# Patient Record
Sex: Male | Born: 1965 | Race: White | Hispanic: No | Marital: Married | Smoking: Never smoker
Health system: Southern US, Community
[De-identification: ages and names within clinical notes are randomized; demographics above are authoritative.]

## PROBLEM LIST (undated history)

## (undated) DIAGNOSIS — J302 Other seasonal allergic rhinitis: Secondary | ICD-10-CM

## (undated) DIAGNOSIS — E039 Hypothyroidism, unspecified: Secondary | ICD-10-CM

## (undated) DIAGNOSIS — G4733 Obstructive sleep apnea (adult) (pediatric): Secondary | ICD-10-CM

## (undated) DIAGNOSIS — T8859XA Other complications of anesthesia, initial encounter: Secondary | ICD-10-CM

## (undated) DIAGNOSIS — I10 Essential (primary) hypertension: Secondary | ICD-10-CM

## (undated) DIAGNOSIS — E785 Hyperlipidemia, unspecified: Secondary | ICD-10-CM

## (undated) DIAGNOSIS — M1990 Unspecified osteoarthritis, unspecified site: Secondary | ICD-10-CM

## (undated) DIAGNOSIS — C8290 Follicular lymphoma, unspecified, unspecified site: Secondary | ICD-10-CM

## (undated) DIAGNOSIS — C8590 Non-Hodgkin lymphoma, unspecified, unspecified site: Secondary | ICD-10-CM

## (undated) HISTORY — PX: UMBILICAL HERNIA REPAIR: SHX196

## (undated) HISTORY — PX: COLONOSCOPY: SHX174

## (undated) HISTORY — PX: KNEE ARTHROSCOPY: SUR90

---

## 2011-06-25 HISTORY — PX: LYMPH NODE DISSECTION: SHX5087

## 2019-11-17 ENCOUNTER — Other Ambulatory Visit: Payer: Self-pay | Admitting: Orthopedic Surgery

## 2019-11-17 DIAGNOSIS — M19011 Primary osteoarthritis, right shoulder: Secondary | ICD-10-CM

## 2019-12-06 ENCOUNTER — Other Ambulatory Visit: Payer: Self-pay

## 2019-12-06 ENCOUNTER — Ambulatory Visit
Admission: RE | Admit: 2019-12-06 | Discharge: 2019-12-06 | Disposition: A | Discharge status: Home / Self Care | Payer: BC Managed Care – PPO | Source: Ambulatory Visit | Attending: Orthopedic Surgery | Admitting: Orthopedic Surgery

## 2019-12-06 DIAGNOSIS — M19011 Primary osteoarthritis, right shoulder: Secondary | ICD-10-CM

## 2019-12-31 ENCOUNTER — Encounter (HOSPITAL_COMMUNITY): Payer: Self-pay

## 2019-12-31 NOTE — Progress Notes (Addendum)
COVID Vaccine Completed: Yes Date COVID Vaccine completed: 08/13/2019 COVID vaccine manufacturer:    Moderna     PCP - Dr Raliegh Ip. [facility] internal Medicine surgical clearance 12/14/19 in chart Cardiologist - N/A  Chest x-ray - request from Dr. Susie Cassette office EKG - request from Dr. Susie Cassette office Stress Test - N/A ECHO - N/A Cardiac Cath - N/A  Sleep Study - Yes CPAP - Yes  Fasting Blood Sugar - N/A Checks Blood Sugar _N/A____ times a day  Blood Thinner Instructions: N/A Aspirin Instructions: N/A Last Dose: N/A  Anesthesia review: N/A  Patient denies shortness of breath, fever, cough and chest pain at PAT appointment   Patient verbalized understanding of instructions that were given to them at the PAT appointment. Patient was also instructed that they will need to review over the PAT instructions again at home before surgery.

## 2019-12-31 NOTE — Patient Instructions (Addendum)
DUE TO COVID-19 ONLY ONE VISITOR ARE ALLOWED TO COME WITH YOU AND STAY IN THE WAITING ROOM ONLY DURING PRE OP AND PROCEDURE. THEN TWO VISITORS MAY VISIT WITH YOU IN YOUR PRIVATE ROOM DURING VISITING HOURS ONLY!!   COVID SWAB TESTING MUST BE COMPLETED ON: Monday, July 12, 1:35PM    [address], [place] [facility] pre surgical testing line (Must self quarantine after testing. Follow instructions on handout.)         Your procedure is scheduled on: Thursday, January 06, 2020   Report to [facility] Main  Entrance   Report to Short Stay at 5:30 AM   [facility])   Call this number if you have problems the morning of surgery [phone]   Do not eat food :After Midnight.   May have liquids until 4:30 AM   day of surgery   CLEAR LIQUID DIET  Foods Allowed                                                                     Foods Excluded  Water, Black Coffee and tea, regular and decaf                             liquids that you cannot  Plain Jell-O in any flavor  (No red)                                           see through such as: Fruit ices (not with fruit pulp)                                     milk, soups, orange juice  Iced Popsicles (No red)                                    All solid food                                   Apple juices Sports drinks like Gatorade (No red) Lightly seasoned clear broth or consume(fat free) Sugar, honey syrup  Sample Menu Breakfast                                Lunch                                     Supper Cranberry juice                    Beef broth                            Chicken broth Jell-O  Grape juice                           Apple juice Coffee or tea                        Jell-O                                      Popsicle                                                Coffee or tea                        Coffee or tea   Complete one Ensure drink  the morning of surgery at 4:30 AM the day of surgery.   Oral Hygiene is also important to reduce your risk of infection.                                    Remember - BRUSH YOUR TEETH THE MORNING OF SURGERY WITH YOUR REGULAR TOOTHPASTE   Do NOT smoke after Midnight   Take these medicines the morning of surgery with A SIP OF WATER: Amlodipine, Cetirizine, Levothyroxine   Bring CPAP mask and tubing day of surgery                               You may not have any metal on your body including jewelry, and body piercings             Do not wear lotions, powders, perfumes/cologne, or deodorant                          Men may shave face and neck.   Do not bring valuables to the hospital. [place].   Contacts, dentures or bridgework may not be worn into surgery.   Bring small overnight bag day of surgery.    Special Instructions: Bring a copy of your healthcare power of attorney and living will documents         the day of surgery if you haven't scanned them in before.              Please read over the following fact sheets you were given: IF YOU HAVE QUESTIONS ABOUT YOUR PRE OP INSTRUCTIONS PLEASE CALL [place]- Preparing for Total Shoulder Arthroplasty    Before surgery, you can play an important role. Because skin is not sterile, your skin needs to be as free of germs as possible. You can reduce the number of germs on your skin by using the following products. . Benzoyl Peroxide Gel o Reduces the number of germs present on the skin o Applied twice a day to shoulder area starting two days before surgery    ==================================================================  Please follow these instructions carefully:  BENZOYL PEROXIDE 5% GEL  Please do not use if  you have an allergy to benzoyl peroxide.   If your skin becomes reddened/irritated stop using the benzoyl peroxide.  Starting two days before surgery,  apply as follows: (Tuesday and Wednesday) 1. Apply benzoyl peroxide in the morning and at night. Apply after taking a shower. If you are not taking a shower clean entire shoulder front, back, and side along with the armpit with a clean wet washcloth.  2. Place a quarter-sized dollop on your shoulder and rub in thoroughly, making sure to cover the front, back, and side of your shoulder, along with the armpit.   2 days before ____ AM   ____ PM              1 day before ____ AM   ____ PM                         3. Do this twice a day for two days.  (Last application is the night before surgery, AFTER using the CHG soap as described below).  4. Do NOT apply benzoyl peroxide gel on the day of surgery.  [place] - Preparing for Surgery Before surgery, you can play an important role.  Because skin is not sterile, your skin needs to be as free of germs as possible.  You can reduce the number of germs on your skin by washing with CHG (chlorahexidine gluconate) soap before surgery.  CHG is an antiseptic cleaner which kills germs and bonds with the skin to continue killing germs even after washing. Please DO NOT use if you have an allergy to CHG or antibacterial soaps.  If your skin becomes reddened/irritated stop using the CHG and inform your nurse when you arrive at Short Stay. Do not shave (including legs and underarms) for at least 48 hours prior to the first CHG shower.  You may shave your face/neck.  Please follow these instructions carefully:  1.  Shower with CHG Soap the night before surgery and the  morning of surgery.  2.  If you choose to wash your hair, wash your hair first as usual with your normal  shampoo.  3.  After you shampoo, rinse your hair and body thoroughly to remove the shampoo.                             4.  Use CHG as you would any other liquid soap.  You can apply chg directly to the skin and wash.  Gently with a scrungie or clean washcloth.  5.  Apply the CHG Soap to your  body ONLY FROM THE NECK DOWN.   Do   not use on face/ open                           Wound or open sores. Avoid contact with eyes, ears mouth and   genitals (private parts).                       Wash face,  Genitals (private parts) with your normal soap.             6.  Wash thoroughly, paying special attention to the area where your    surgery  will be performed.  7.  Thoroughly rinse your body with warm water from the neck down.  8.  DO NOT shower/wash with your normal soap after  using and rinsing off the CHG Soap.                9.  Pat yourself dry with a clean towel.            10.  Wear clean pajamas.            11.  Place clean sheets on your bed the night of your first shower and do not  sleep with pets. Day of Surgery : Do not apply any lotions/deodorants the morning of surgery.  Please wear clean clothes to the hospital/surgery center.  FAILURE TO FOLLOW THESE INSTRUCTIONS MAY RESULT IN THE CANCELLATION OF YOUR SURGERY  PATIENT SIGNATURE_________________________________  NURSE SIGNATURE__________________________________  ________________________________________________________________________   Adam Phenix  An incentive spirometer is a tool that can help keep your lungs clear and active. This tool measures how well you are filling your lungs with each breath. Taking long deep breaths may help reverse or decrease the chance of developing breathing (pulmonary) problems (especially infection) following:  A long period of time when you are unable to move or be active. BEFORE THE PROCEDURE   If the spirometer includes an indicator to show your best effort, your nurse or respiratory therapist will set it to a desired goal.  If possible, sit up straight or lean slightly forward. Try not to slouch.  Hold the incentive spirometer in an upright position. INSTRUCTIONS FOR USE  1. Sit on the edge of your bed if possible, or sit up as far as you can in bed or on a  chair. 2. Hold the incentive spirometer in an upright position. 3. Breathe out normally. 4. Place the mouthpiece in your mouth and seal your lips tightly around it. 5. Breathe in slowly and as deeply as possible, raising the piston or the ball toward the top of the column. 6. Hold your breath for 3-5 seconds or for as long as possible. Allow the piston or ball to fall to the bottom of the column. 7. Remove the mouthpiece from your mouth and breathe out normally. 8. Rest for a few seconds and repeat Steps 1 through 7 at least 10 times every 1-2 hours when you are awake. Take your time and take a few normal breaths between deep breaths. 9. The spirometer may include an indicator to show your best effort. Use the indicator as a goal to work toward during each repetition. 10. After each set of 10 deep breaths, practice coughing to be sure your lungs are clear. If you have an incision (the cut made at the time of surgery), support your incision when coughing by placing a pillow or rolled up towels firmly against it. Once you are able to get out of bed, walk around indoors and cough well. You may stop using the incentive spirometer when instructed by your caregiver.  RISKS AND COMPLICATIONS  Take your time so you do not get dizzy or light-headed.  If you are in pain, you may need to take or ask for pain medication before doing incentive spirometry. It is harder to take a deep breath if you are having pain. AFTER USE  Rest and breathe slowly and easily.  It can be helpful to keep track of a log of your progress. Your caregiver can provide you with a simple table to help with this. If you are using the spirometer at home, follow these instructions: [place] IF:   You are having difficultly using the spirometer.  You have trouble  using the spirometer as often as instructed.  Your pain medication is not giving enough relief while using the spirometer.  You develop fever of 100.5 F  (38.1 C) or higher. SEEK IMMEDIATE MEDICAL CARE IF:   You cough up bloody sputum that had not been present before.  You develop fever of 102 F (38.9 C) or greater.  You develop worsening pain at or near the incision site. MAKE SURE YOU:   Understand these instructions.  Will watch your condition.  Will get help right away if you are not doing well or get worse. Document Released: 10/21/2006 Document Revised: 09/02/2011 Document Reviewed: 12/22/2006 [facility] Patient Information 2014 [place], [place].   ________________________________________________________________________

## 2020-01-03 ENCOUNTER — Other Ambulatory Visit: Payer: Self-pay

## 2020-01-03 ENCOUNTER — Other Ambulatory Visit (HOSPITAL_COMMUNITY)
Admission: RE | Admit: 2020-01-03 | Discharge: 2020-01-03 | Disposition: A | Discharge status: Home / Self Care | Payer: BC Managed Care – PPO | Source: Ambulatory Visit | Attending: Orthopedic Surgery | Admitting: Orthopedic Surgery

## 2020-01-03 ENCOUNTER — Encounter (HOSPITAL_COMMUNITY): Payer: Self-pay

## 2020-01-03 ENCOUNTER — Encounter (HOSPITAL_COMMUNITY)
Admission: RE | Admit: 2020-01-03 | Discharge: 2020-01-03 | Disposition: A | Discharge status: Home / Self Care | Payer: BC Managed Care – PPO | Source: Ambulatory Visit | Attending: Orthopedic Surgery | Admitting: Orthopedic Surgery

## 2020-01-03 DIAGNOSIS — Z01812 Encounter for preprocedural laboratory examination: Secondary | ICD-10-CM | POA: Diagnosis not present

## 2020-01-03 DIAGNOSIS — Z20822 Contact with and (suspected) exposure to covid-19: Secondary | ICD-10-CM | POA: Insufficient documentation

## 2020-01-03 HISTORY — DX: Other seasonal allergic rhinitis: J30.2

## 2020-01-03 HISTORY — DX: Non-Hodgkin lymphoma, unspecified, unspecified site: C85.90

## 2020-01-03 HISTORY — DX: Hyperlipidemia, unspecified: E78.5

## 2020-01-03 HISTORY — DX: Other complications of anesthesia, initial encounter: T88.59XA

## 2020-01-03 HISTORY — DX: Follicular lymphoma, unspecified, unspecified site: C82.90

## 2020-01-03 HISTORY — DX: Obstructive sleep apnea (adult) (pediatric): G47.33

## 2020-01-03 HISTORY — DX: Unspecified osteoarthritis, unspecified site: M19.90

## 2020-01-03 HISTORY — DX: Essential (primary) hypertension: I10

## 2020-01-03 HISTORY — DX: Hypothyroidism, unspecified: E03.9

## 2020-01-03 LAB — BASIC METABOLIC PANEL
Anion gap: 9 (ref 5–15)
BUN: 18 mg/dL (ref 6–20)
CO2: 28 mmol/L (ref 22–32)
Calcium: 9.2 mg/dL (ref 8.9–10.3)
Chloride: 102 mmol/L (ref 98–111)
Creatinine, Ser: 0.95 mg/dL (ref 0.61–1.24)
GFR calc Af Amer: >60 mL/min (ref >60)
GFR calc non Af Amer: >60 mL/min (ref >60)
Glucose, Bld: 93 mg/dL (ref 70–99)
Potassium: 3.9 mmol/L (ref 3.5–5.1)
Sodium: 139 mmol/L (ref 135–145)

## 2020-01-03 LAB — CBC
HCT: 44.4 % (ref 39.0–52.0)
Hemoglobin: 14.6 g/dL (ref 13.0–17.0)
MCH: 30.4 pg (ref 26.0–34.0)
MCHC: 32.9 g/dL (ref 30.0–36.0)
MCV: 92.5 fL (ref 80.0–100.0)
Platelets: 151 10*3/uL (ref 150–400)
RBC: 4.8 MIL/uL (ref 4.22–5.81)
RDW: 13.7 % (ref 11.5–15.5)
WBC: 7.3 10*3/uL (ref 4.0–10.5)
nRBC: 0 % (ref 0.0–0.2)

## 2020-01-03 LAB — SURGICAL PCR SCREEN
MRSA, PCR: NEGATIVE
Staphylococcus aureus: POSITIVE — AB

## 2020-01-03 LAB — SARS CORONAVIRUS 2 (TAT 6-24 HRS): SARS Coronavirus 2: NEGATIVE

## 2020-01-05 MED ORDER — DEXTROSE 5 % IV SOLN
3.0000 g | INTRAVENOUS | Status: AC
  Administered 2020-01-06: 2 g via INTRAVENOUS
  Filled 2020-01-05: qty 3

## 2020-01-05 NOTE — Anesthesia Preprocedure Evaluation (Addendum)
Anesthesia Evaluation   Patient awake    Reviewed: Allergy & Precautions, NPO status , Patient's Chart, lab work & pertinent test results  History of Anesthesia Complications (+) DIFFICULT AIRWAY and history of anesthetic complications  Airway Mallampati: III       Dental no notable dental hx.    Pulmonary sleep apnea and Continuous Positive Airway Pressure Ventilation ,    Pulmonary exam normal        Cardiovascular hypertension, Pt. on medications Normal cardiovascular exam     Neuro/Psych negative neurological ROS  negative psych ROS   GI/Hepatic negative GI ROS, Neg liver ROS,   Endo/Other  Hypothyroidism Morbid obesity  Renal/GU negative Renal ROS  negative genitourinary   Musculoskeletal  (+) Arthritis , Osteoarthritis,    Abdominal (+) + obese,   Peds  Hematology   Anesthesia Other Findings   Reproductive/Obstetrics                            Anesthesia Physical Anesthesia Plan  ASA: III  Anesthesia Plan: General   Post-op Pain Management:  Regional for Post-op pain   Induction: Intravenous  PONV Risk Score and Plan: 2 and Midazolam, Ondansetron and Dexamethasone  Airway Management Planned: Oral ETT and Video Laryngoscope Planned  Additional Equipment: None  Intra-op Plan:   Post-operative Plan: Extubation in OR  Informed Consent: I have reviewed the patients History and Physical, chart, labs and discussed the procedure including the risks, benefits and alternatives for the proposed anesthesia with the patient or authorized representative who has indicated his/her understanding and acceptance.       Plan Discussed with: CRNA  Anesthesia Plan Comments:        Anesthesia Quick Evaluation

## 2020-01-05 NOTE — Progress Notes (Addendum)
PCR results 01/04/2020 faxed via epic to Dr. Susie Cassette office.  Second request: Called to check with Claiborne Billings to see if Dr. Doran Clay office had sent over the EKG tracing.  She will follow up and fax to [place].

## 2020-01-05 NOTE — Progress Notes (Signed)
EKG 11/2019 from Dr. Doran Clay office received and placed in chart.

## 2020-01-06 ENCOUNTER — Ambulatory Visit (HOSPITAL_COMMUNITY)
Admission: RE | Admit: 2020-01-06 | Discharge: 2020-01-06 | Disposition: A | Discharge status: Home / Self Care | Payer: BC Managed Care – PPO | Attending: Orthopedic Surgery | Admitting: Orthopedic Surgery

## 2020-01-06 ENCOUNTER — Encounter (HOSPITAL_COMMUNITY): Payer: Self-pay | Admitting: Orthopedic Surgery

## 2020-01-06 ENCOUNTER — Ambulatory Visit (HOSPITAL_COMMUNITY): Payer: BC Managed Care – PPO | Admitting: Anesthesiology

## 2020-01-06 ENCOUNTER — Encounter (HOSPITAL_COMMUNITY)
Admission: RE | Disposition: A | Discharge status: Home / Self Care | Payer: Self-pay | Source: Home / Self Care | Attending: Orthopedic Surgery

## 2020-01-06 DIAGNOSIS — Z79899 Other long term (current) drug therapy: Secondary | ICD-10-CM | POA: Insufficient documentation

## 2020-01-06 DIAGNOSIS — Z8572 Personal history of non-Hodgkin lymphomas: Secondary | ICD-10-CM | POA: Diagnosis not present

## 2020-01-06 DIAGNOSIS — E785 Hyperlipidemia, unspecified: Secondary | ICD-10-CM | POA: Insufficient documentation

## 2020-01-06 DIAGNOSIS — E039 Hypothyroidism, unspecified: Secondary | ICD-10-CM | POA: Insufficient documentation

## 2020-01-06 DIAGNOSIS — M19011 Primary osteoarthritis, right shoulder: Secondary | ICD-10-CM | POA: Diagnosis not present

## 2020-01-06 DIAGNOSIS — Z6841 Body Mass Index (BMI) 40.0 and over, adult: Secondary | ICD-10-CM | POA: Diagnosis not present

## 2020-01-06 DIAGNOSIS — Z79890 Hormone replacement therapy (postmenopausal): Secondary | ICD-10-CM | POA: Insufficient documentation

## 2020-01-06 DIAGNOSIS — Z791 Long term (current) use of non-steroidal anti-inflammatories (NSAID): Secondary | ICD-10-CM | POA: Insufficient documentation

## 2020-01-06 DIAGNOSIS — I10 Essential (primary) hypertension: Secondary | ICD-10-CM | POA: Insufficient documentation

## 2020-01-06 DIAGNOSIS — G4733 Obstructive sleep apnea (adult) (pediatric): Secondary | ICD-10-CM | POA: Diagnosis not present

## 2020-01-06 HISTORY — PX: TOTAL SHOULDER ARTHROPLASTY: SHX126

## 2020-01-06 SURGERY — ARTHROPLASTY, SHOULDER, TOTAL
Anesthesia: General | Site: Shoulder | Laterality: Right

## 2020-01-06 MED ORDER — DEXAMETHASONE SODIUM PHOSPHATE 10 MG/ML IJ SOLN
INTRAMUSCULAR | Status: DC | PRN
  Administered 2020-01-06: 10 mg via INTRAVENOUS

## 2020-01-06 MED ORDER — HYDROMORPHONE HCL 1 MG/ML IJ SOLN: 0.2500 mg | INTRAMUSCULAR | Status: DC | PRN

## 2020-01-06 MED ORDER — LACTATED RINGERS IV BOLUS
500.0000 mL | Freq: Once | INTRAVENOUS | Status: AC
  Administered 2020-01-06: 500 mL via INTRAVENOUS

## 2020-01-06 MED ORDER — VANCOMYCIN HCL 1000 MG IV SOLR
INTRAVENOUS | Status: AC
  Filled 2020-01-06: qty 1000

## 2020-01-06 MED ORDER — VANCOMYCIN HCL 1000 MG IV SOLR
INTRAVENOUS | Status: DC | PRN
  Administered 2020-01-06: 1000 mg via TOPICAL

## 2020-01-06 MED ORDER — MIDAZOLAM HCL 2 MG/2ML IJ SOLN
INTRAMUSCULAR | Status: DC | PRN
  Administered 2020-01-06: 2 mg via INTRAVENOUS

## 2020-01-06 MED ORDER — CYCLOBENZAPRINE HCL 10 MG PO TABS
10.0000 mg | ORAL_TABLET | Freq: Three times a day (TID) | ORAL | 1 refills | Status: AC | PRN
Start: 2020-01-06 — End: ?

## 2020-01-06 MED ORDER — PROPOFOL 10 MG/ML IV BOLUS
INTRAVENOUS | Status: AC
  Filled 2020-01-06: qty 20

## 2020-01-06 MED ORDER — PHENYLEPHRINE HCL-NACL 10-0.9 MG/250ML-% IV SOLN
INTRAVENOUS | Status: DC | PRN
Start: 2020-01-06 — End: 2020-01-06
  Administered 2020-01-06 (×2): 25 ug/min via INTRAVENOUS

## 2020-01-06 MED ORDER — PROPOFOL 10 MG/ML IV BOLUS
INTRAVENOUS | Status: DC | PRN
  Administered 2020-01-06: 200 mg via INTRAVENOUS

## 2020-01-06 MED ORDER — MELOXICAM 15 MG PO TABS: 15.0000 mg | ORAL_TABLET | Freq: Every day | ORAL | 1 refills | Status: AC

## 2020-01-06 MED ORDER — KETOROLAC TROMETHAMINE 30 MG/ML IJ SOLN: 30.0000 mg | Freq: Once | INTRAMUSCULAR | Status: DC | PRN

## 2020-01-06 MED ORDER — LACTATED RINGERS IV BOLUS: 250.0000 mL | Freq: Once | INTRAVENOUS | Status: DC

## 2020-01-06 MED ORDER — 0.9 % SODIUM CHLORIDE (POUR BTL) OPTIME
TOPICAL | Status: DC | PRN
  Administered 2020-01-06: 1000 mL

## 2020-01-06 MED ORDER — BUPIVACAINE-EPINEPHRINE (PF) 0.5% -1:200000 IJ SOLN
INTRAMUSCULAR | Status: DC | PRN
  Administered 2020-01-06 (×5): 3 mL via PERINEURAL

## 2020-01-06 MED ORDER — ONDANSETRON HCL 4 MG/2ML IJ SOLN
INTRAMUSCULAR | Status: DC | PRN
  Administered 2020-01-06: 4 mg via INTRAVENOUS

## 2020-01-06 MED ORDER — LACTATED RINGERS IV SOLN: INTRAVENOUS | Status: DC

## 2020-01-06 MED ORDER — OXYCODONE-ACETAMINOPHEN 5-325 MG PO TABS: 1.0000 | ORAL_TABLET | ORAL | 0 refills | Status: AC | PRN

## 2020-01-06 MED ORDER — TRANEXAMIC ACID-NACL 1000-0.7 MG/100ML-% IV SOLN
1000.0000 mg | INTRAVENOUS | Status: AC
  Administered 2020-01-06: 1000 mg via INTRAVENOUS
  Filled 2020-01-06 (×2): qty 100

## 2020-01-06 MED ORDER — FENTANYL CITRATE (PF) 250 MCG/5ML IJ SOLN
INTRAMUSCULAR | Status: DC | PRN
  Administered 2020-01-06 (×5): 50 ug via INTRAVENOUS

## 2020-01-06 MED ORDER — MIDAZOLAM HCL 2 MG/2ML IJ SOLN
INTRAMUSCULAR | Status: AC
  Filled 2020-01-06: qty 2

## 2020-01-06 MED ORDER — MEPERIDINE HCL 50 MG/ML IJ SOLN: 6.2500 mg | INTRAMUSCULAR | Status: DC | PRN

## 2020-01-06 MED ORDER — SUGAMMADEX SODIUM 500 MG/5ML IV SOLN
INTRAVENOUS | Status: DC | PRN
  Administered 2020-01-06: 400 mg via INTRAVENOUS

## 2020-01-06 MED ORDER — STERILE WATER FOR IRRIGATION IR SOLN
Status: DC | PRN
  Administered 2020-01-06: 2000 mL

## 2020-01-06 MED ORDER — CHLORHEXIDINE GLUCONATE 0.12 % MT SOLN
15.0000 mL | Freq: Once | OROMUCOSAL | Status: AC
  Administered 2020-01-06: 15 mL via OROMUCOSAL

## 2020-01-06 MED ORDER — BUPIVACAINE LIPOSOME 1.3 % IJ SUSP
INTRAMUSCULAR | Status: DC | PRN
  Administered 2020-01-06 (×5): 2 mL via PERINEURAL

## 2020-01-06 MED ORDER — FENTANYL CITRATE (PF) 250 MCG/5ML IJ SOLN
INTRAMUSCULAR | Status: AC
  Filled 2020-01-06: qty 5

## 2020-01-06 MED ORDER — PROMETHAZINE HCL 25 MG/ML IJ SOLN: 6.2500 mg | INTRAMUSCULAR | Status: DC | PRN

## 2020-01-06 MED ORDER — ROCURONIUM BROMIDE 10 MG/ML (PF) SYRINGE
PREFILLED_SYRINGE | INTRAVENOUS | Status: DC | PRN
  Administered 2020-01-06: 20 mg via INTRAVENOUS
  Administered 2020-01-06: 40 mg via INTRAVENOUS

## 2020-01-06 MED ORDER — LIDOCAINE 2% (20 MG/ML) 5 ML SYRINGE
INTRAMUSCULAR | Status: DC | PRN
  Administered 2020-01-06: 100 mg via INTRAVENOUS

## 2020-01-06 MED ORDER — ONDANSETRON HCL 4 MG PO TABS: 4.0000 mg | ORAL_TABLET | Freq: Three times a day (TID) | ORAL | 0 refills | Status: AC | PRN

## 2020-01-06 MED ORDER — SUCCINYLCHOLINE CHLORIDE 200 MG/10ML IV SOSY
PREFILLED_SYRINGE | INTRAVENOUS | Status: DC | PRN
  Administered 2020-01-06: 200 mg via INTRAVENOUS

## 2020-01-06 MED ORDER — ORAL CARE MOUTH RINSE: 15.0000 mL | Freq: Once | OROMUCOSAL | Status: AC

## 2020-01-06 SURGICAL SUPPLY — 64 items
BAG ZIPLOCK 12X15 (MISCELLANEOUS) ×3 IMPLANT
BIT DRILL 2.0X128 (BIT) ×2 IMPLANT
BIT DRILL 2.0X128MM (BIT) ×1
BLADE SAW SGTL 83.5X18.5 (BLADE) ×3 IMPLANT
BODY HUM ECLIPSE TRUNION OD 51 (Shoulder) ×3 IMPLANT
CEMENT BONE DEPUY (Cement) ×3 IMPLANT
COOLER ICEMAN CLASSIC (MISCELLANEOUS) IMPLANT
COVER BACK TABLE 60X90IN (DRAPES) ×3 IMPLANT
COVER SURGICAL LIGHT HANDLE (MISCELLANEOUS) ×3 IMPLANT
COVER WAND RF STERILE (DRAPES) IMPLANT
DERMABOND ADVANCED (GAUZE/BANDAGES/DRESSINGS) ×2
DERMABOND ADVANCED .7 DNX12 (GAUZE/BANDAGES/DRESSINGS) ×1 IMPLANT
DRAPE ORTHO SPLIT 77X108 STRL (DRAPES) ×4
DRAPE SHEET LG 3/4 BI-LAMINATE (DRAPES) ×3 IMPLANT
DRAPE SURG 17X11 SM STRL (DRAPES) ×3 IMPLANT
DRAPE SURG ORHT 6 SPLT 77X108 (DRAPES) ×2 IMPLANT
DRAPE U-SHAPE 47X51 STRL (DRAPES) ×3 IMPLANT
DRSG AQUACEL AG ADV 3.5X10 (GAUZE/BANDAGES/DRESSINGS) ×3 IMPLANT
DURAPREP 26ML APPLICATOR (WOUND CARE) ×6 IMPLANT
ELECT BLADE TIP CTD 4 INCH (ELECTRODE) ×3 IMPLANT
ELECT REM PT RETURN 15FT ADLT (MISCELLANEOUS) ×3 IMPLANT
FACESHIELD WRAPAROUND (MASK) ×15 IMPLANT
GLENDOID UNI VAULTLOCK EXLG (Shoulder) ×3 IMPLANT
GLENOID UNI VAULTLOCK EXLG (Shoulder) ×1 IMPLANT
GLOVE BIO SURGEON STRL SZ7.5 (GLOVE) ×3 IMPLANT
GLOVE BIO SURGEON STRL SZ8 (GLOVE) ×3 IMPLANT
GLOVE SS BIOGEL STRL SZ 7 (GLOVE) ×1 IMPLANT
GLOVE SS BIOGEL STRL SZ 7.5 (GLOVE) ×1 IMPLANT
GLOVE SUPERSENSE BIOGEL SZ 7 (GLOVE) ×2
GLOVE SUPERSENSE BIOGEL SZ 7.5 (GLOVE) ×2
GOWN STRL REUS W/TWL LRG LVL3 (GOWN DISPOSABLE) ×6 IMPLANT
HEAD HUMERAL ECLIPSE 49/20 (Shoulder) ×3 IMPLANT
IMPL ECLIPSE SPEEDCAP (Shoulder) ×1 IMPLANT
IMPLANT ECLIPSE SPEEDCAP (Shoulder) ×3 IMPLANT
KIT BASIN OR (CUSTOM PROCEDURE TRAY) ×3 IMPLANT
KIT TURNOVER KIT A (KITS) IMPLANT
MANIFOLD NEPTUNE II (INSTRUMENTS) ×3 IMPLANT
MARKER SKIN DUAL TIP RULER LAB (MISCELLANEOUS) ×3 IMPLANT
NEEDLE TAPERED W/ NITINOL LOOP (MISCELLANEOUS) IMPLANT
NS IRRIG 1000ML POUR BTL (IV SOLUTION) ×3 IMPLANT
PACK SHOULDER (CUSTOM PROCEDURE TRAY) ×3 IMPLANT
PAD COLD SHLDR WRAP-ON (PAD) IMPLANT
PROTECTOR NERVE ULNAR (MISCELLANEOUS) ×3 IMPLANT
RESTRAINT HEAD UNIVERSAL NS (MISCELLANEOUS) ×3 IMPLANT
SCREW SPINAL 40 F/CAGE LRG (Screw) ×3 IMPLANT
SLING ARM FOAM STRAP LRG (SOFTGOODS) IMPLANT
SLING ARM FOAM STRAP MED (SOFTGOODS) IMPLANT
SLING ARM FOAM STRAP XLG (SOFTGOODS) ×3 IMPLANT
SMARTMIX MINI TOWER (MISCELLANEOUS)
SPONGE LAP 18X18 RF (DISPOSABLE) IMPLANT
SUCTION FRAZIER HANDLE 12FR (TUBING) ×2
SUCTION TUBE FRAZIER 12FR DISP (TUBING) ×1 IMPLANT
SUT FIBERWIRE #2 38 T-5 BLUE (SUTURE)
SUT MNCRL AB 3-0 PS2 18 (SUTURE) ×3 IMPLANT
SUT MON AB 2-0 CT1 36 (SUTURE) ×3 IMPLANT
SUT VIC AB 1 CT1 36 (SUTURE) ×9 IMPLANT
SUTURE FIBERWR #2 38 T-5 BLUE (SUTURE) IMPLANT
SUTURE TAPE 1.3 40 TPR END (SUTURE) IMPLANT
SUTURETAPE 1.3 40 TPR END (SUTURE)
TOWEL OR 17X26 10 PK STRL BLUE (TOWEL DISPOSABLE) ×3 IMPLANT
TOWEL OR NON WOVEN STRL DISP B (DISPOSABLE) ×3 IMPLANT
TOWER SMARTMIX MINI (MISCELLANEOUS) IMPLANT
WATER STERILE IRR 1000ML POUR (IV SOLUTION) ×6 IMPLANT
YANKAUER SUCT BULB TIP 10FT TU (MISCELLANEOUS) ×3 IMPLANT

## 2020-01-06 NOTE — Anesthesia Procedure Notes (Signed)
Procedure Name: Intubation Date/Time: 01/06/2020 7:38 AM Performed by: Cynda Familia, CRNA Pre-anesthesia Checklist: Patient identified, Emergency Drugs available, Suction available and Patient being monitored Patient Re-evaluated:Patient Re-evaluated prior to induction Oxygen Delivery Method: Circle System Utilized Preoxygenation: Pre-oxygenation with 100% oxygen Induction Type: IV induction, Rapid sequence and Cricoid Pressure applied Ventilation: Mask ventilation without difficulty Laryngoscope Size: Miller and 2 Grade View: Grade I Tube type: Oral Tube size: 7.5 mm Number of attempts: 1 Airway Equipment and Method: Stylet Placement Confirmation: ETT inserted through vocal cords under direct vision,  positive ETCO2 and breath sounds checked- equal and bilateral Secured at: 22 cm Tube secured with: Tape Dental Injury: Teeth and Oropharynx as per pre-operative assessment  Comments: Smooth IV induction Hatchette-- intubation AM CRNA atraumatic-- teeth and mouth as preop- bilat BS

## 2020-01-06 NOTE — Anesthesia Procedure Notes (Signed)
Performed by: Con Arganbright M, CRNA Oxygen Delivery Method: Simple face mask Placement Confirmation: positive ETCO2 and breath sounds checked- equal and bilateral Dental Injury: Teeth and Oropharynx as per pre-operative assessment        

## 2020-01-06 NOTE — Op Note (Signed)
01/06/2020  10:31 AM  PATIENT:   Jason Gill  54 y.o. male  PRE-OPERATIVE DIAGNOSIS:  Right shoulder osteoarthritis  POST-OPERATIVE DIAGNOSIS: Same  PROCEDURE: Anatomic right total shoulder arthroplasty utilizing a stemless Arthrex Eclipse implant size 49 x 20 humeral head and an extra large polyethylene glenoid.  SURGEON:  Marin Shutter M.D.  ASSISTANTS: Jenetta Loges, PA-C  ANESTHESIA:   General endotracheal and interscalene block with Exparel  EBL: 300 cc  SPECIMEN: None  Drains: None   PATIENT DISPOSITION:  PACU - hemodynamically stable.    PLAN OF CARE: Discharge to home after PACU  Brief history:  Jason Gill is a 54 year old gentleman who has had a long history of right shoulder pain and restricted mobility with increasing functional rotations related to advanced osteoarthritis.  His recent plain film x-rays confirm complete loss of joint space with significant bony deformity and large peripheral osteophytes as well as loose bodies anterior and posterior to the joint.  Preoperative CT scan was obtained to help with preoperative planning.  Due to his increasing pain and functional rotations he is brought to the operating this time for planned right shoulder anatomic plasty.  Preoperatively, I counseled the patient regarding treatment options and risks versus benefits thereof.  Possible surgical complications were all reviewed including potential for bleeding, infection, neurovascular injury, persistent pain, loss of motion, anesthetic complication, failure of the implant, and possible need for additional surgery. They understand and accept and agrees with our planned procedure.   Procedure detail:  After undergoing routine preop evaluation patient received prophylactic antibiotics and interscalene block with Exparel established in the holding area by the anesthesia department.  Patient was subsequently placed supine in the operating table and underwent the smooth  induction of a general endotracheal anesthesia.  Then placed into the beachchair position and appropriately padded and protected.  The right shoulder girdle region was then sterilely prepped and draped in standard fashion.  Timeout was called.  An anterior deltopectoral approach to the right shoulder is made through a 12 cm incision.  Skin flaps were elevated dissection carried deeply and the deltopectoral interval was developed from proximal to distal with the vein taken laterally.  The upper centimeter half the pectoralis major was tenotomized for exposure and the long head biceps tendon was then tenodesed at the upper border the pectoralis major with proximal segment unroofed and excised.  Conjoined tendon mobilized retracted medially the rotator cuff was then split along the rotator interval to the base of the coracoid and then we identified the superior and inferior margins of the subscapularis insertion onto the lesser tuberosity and with this outlined we used an oscillating saw to create a lesser tuberosity osteotomy removing a thin wafer of bone and subsequently mobilized the subscapularis which was then tagged and reflected medially.  Capsular attachments from the anterior and inferior margins of the humeral neck were then divided in a subperiosteal fashion and there was a very large osteophyte on the humeral neck which we excised with combination of osteotomes and the rondure allowing complete visualization of the margin of the humeral neck.  Once the humeral head was then delivered through the wound we outlined our proposed humeral head resection using our extra medullary guide and an oscillating saw was then used to perform our humeral head cut carefully protecting the rotator cuff superiorly and posteriorly.  We then removed the balance of the osteophytes from the margins of the humeral neck.  We then sized the proximal humerus at a  49.  The proximal humerus was then prepared localizing the position for  the ultimate use of our central screw on the stemless implant.  A size 50 metal cap was then placed over the cut proximal humeral surface and at this point we then gained exposure of the glenoid using appropriate retractors.  Soft tissue releases were completed such that appropriate closure could be obtained.  We then performed a circumferential labral resection gaining complete visualization of the periphery of the glenoid.  We then utilized our VIP guide for positioning of our guidepin into the glenoid which was based off of the preoperative CT scan.  Once the guide was positioned a guidepin was then directed into the center of the glenoid and this indeed confirmed that the extra large implant was the appropriate size.  The glenoid was then reamed with the extra large reamer to a stable subchondral bone bed.  Glenoid preparation was then completed with our central drill and the guide was then used to place the superior and inferior peg and slot respectively and the plan was then broached and a trial showed excellent fit and fixation.  Of note he had extremely sclerotic bone.  At this point cement was mixed and introduced into the superior and inferior peg and slot respectively.  Morselized bone was then impacted around the central peg of the glenoid and the glenoid was then impacted with excellent fit and fixation.  At this point we then returned our attention back to the proximal humerus where the metal cap was removed.  We placed the central screw of our stem was implanted after the collar had been impacted and he achieved excellent fixation.  We then performed a series of trial reductions and ultimately felt that the 49 x 20 head gave [place] the appropriate soft tissue balance with approximately 50% translation across the glenoid.  At this point the trial was removed.  We placed a series of 3 medial row fiber tape sutures to assist with the repair of the subscapularis.  Once these were placed the final humeral head  was then impacted after the [facility] taper was cleaned and dried.  We then performed a final reduction again showing excellent soft tissue balancing and good motion and stability.  At this time we will mobilized the subscapularis and identified that there was a very large osteochondral body within the most medial aspect of the subscapularis which was sessile in its positioning and had apparently developed on the extra articular side of the subscapularis deep beneath the conjoined tendon.  This was a an extremely large fragment of bone and was felt to potentially compromise the functioning of the subscap and the implant and as such meticulous dissection of this large bony fragment was performed ultimately removing it as a single dominant fragment with 2 smaller pieces excised and dimensions were approximately 3 x 3 x 4cm.  I should also mention that there was a very large posterior osteochondral body in the posterior soft tissues which we also removed prior to implantation of the glenoid.  Once these large extra-articular osteochondral bodies were removed we confirmed that the subscapularis had good mobility and good elasticity.  We then utilized a double row repair technique with the 3 previously placed medial row suture anchors passing through the bone tendon junction of the lesser tuberosity osteotomy and these were then placed in a sequential fashion into 2 Lateral Row anchors which allowed excellent reapposition of the LTO against the donor site on the metaphysis and  excellent stability was achieved.  Then repaired the rotator interval with the suture tape sutures.  Upon completion the arm easily showed 45 degrees of external rotation without excessive tension on the subscap repair.  The wound was then copious irrigated.  We did apply copious amounts of powdered vancomycin into the deep and superficial soft tissue layers.  The deltopectoral interval was repaired with a series of figure-of-eight and 1 Vicryl  sutures.  2-0 Vicryl used for subcu layer and intracuticular 3 Monocryl for the skin followed by Dermabond Aquacel dressing.  Right arm was placed into a sling immobilizer and the patient was awakened, extubated, and taken to the recovery room in stable condition.  Jenetta Loges, PA-C was utilized as an Environmental consultant throughout this case, essential for help with positioning the patient, positioning extremity, tissue manipulation, implantation of the prosthesis, suture management, wound closure, and intraoperative decision-making.  Marin Shutter MD   Contact # [phone]

## 2020-01-06 NOTE — Discharge Instructions (Signed)
 Kevin M. Supple, M.D., F.A.A.O.S. Orthopaedic Surgery Specializing in Arthroscopic and Reconstructive Surgery of the Shoulder [phone] [address] - [place], [place] [address] - Fax [phone]   POST-OP TOTAL SHOULDER REPLACEMENT INSTRUCTIONS  1. Follow up in the office for your first post-op appointment 10-14 days from the date of your surgery. If you do not already have a scheduled appointment, our office will contact you to schedule.  2. The bandage over your incision is waterproof. You may begin showering with this dressing on. You may leave this dressing on until first follow up appointment within 2 weeks. We prefer you leave this dressing in place until follow up however after 5-7 days if you are having itching or skin irritation and would like to remove it you may do so. Go slow and tug at the borders gently to break the bond the dressing has with the skin. At this point if there is no drainage it is okay to go without a bandage or you may cover it with a light guaze and tape. You can also expect significant bruising around your shoulder that will drift down your arm and into your chest wall. This is very normal and should resolve over several days.   3. Wear your sling/immobilizer at all times except to perform the exercises below or to occasionally let your arm dangle by your side to stretch your elbow. You also need to sleep in your sling immobilizer until instructed otherwise. It is ok to remove your sling if you are sitting in a controlled environment and allow your arm to rest in a position of comfort by your side or on your lap with pillows to give your neck and skin a break from the sling. You may remove it to allow arm to dangle by side to shower. If you are up walking around and when you go to sleep at night you need to wear it.  4. Range of motion to your elbow, wrist, and hand are encouraged 3-5 times daily. Exercise to your hand and fingers helps to reduce  swelling you may experience.   5. Prescriptions for a pain medication and a muscle relaxant are provided for you. It is recommended that if you are experiencing pain that you pain medication alone is not controlling, add the muscle relaxant along with the pain medication which can give additional pain relief. The first 1-2 days is generally the most severe of your pain and then should gradually decrease. As your pain lessens it is recommended that you decrease your use of the pain medications to an "as needed basis'" only and to always comply with the recommended dosages of the pain medications.  6. Pain medications can produce constipation along with their use. If you experience this, the use of an over the counter stool softener or laxative daily is recommended.   7. For additional questions or concerns, please do not hesitate to call the office. If after hours there is an answering service to forward your concerns to the physician on call.  8.Pain control following an exparel block  To help control your post-operative pain you received a nerve block  performed with Exparel which is a long acting anesthetic (numbing agent) which can provide pain relief and sensations of numbness (and relief of pain) in the operative shoulder and arm for up to 3 days. Sometimes it provides mixed relief, meaning you may still have numbness in certain areas of the arm but can still be able to   move  parts of that arm, hand, and fingers. We recommend that your prescribed pain medications  be used as needed. We do not feel it is necessary to "pre medicate" and "stay ahead" of pain.  Taking narcotic pain medications when you are not having any pain can lead to unnecessary and potentially dangerous side effects.    9. Use the ice machine as much as possible in the first 5-7 days from surgery, then you can wean its use to as needed. The ice typically needs to be replaced every 6 hours, instead of ice you can actually freeze  water bottles to put in the cooler and then fill water around them to avoid having to purchase ice. You can have spare water bottles freezing to allow you to rotate them once they have melted. Try to have a thin shirt or light cloth or towel under the ice wrap to protect your skin.   10.  We recommend that you avoid any dental work or cleaning in the first 3 months following your joint replacement. This is to help minimize the possibility of infection from the bacteria in your mouth that enters your bloodstream during dental work. We also recommend that you take an antibiotic prior to your dental work for the first year after your shoulder replacement to further help reduce that risk. Please simply contact our office for antibiotics to be sent to your pharmacy prior to dental work.  11. Dental Antibiotics:  In most cases prophylactic antibiotics for Dental procdeures after total joint surgery are not necessary.  Exceptions are as follows:  1. History of prior total joint infection  2. Severely immunocompromised (Organ Transplant, cancer chemotherapy, Rheumatoid biologic meds such as Humera)  3. Poorly controlled diabetes (A1C &gt; 8.0, blood glucose over 200)  If you have one of these conditions, contact your surgeon for an antibiotic prescription, prior to your dental procedure.   POST-OP EXERCISES  Pendulum Exercises  Perform pendulum exercises while standing and bending at the waist. Support your uninvolved arm on a table or chair and allow your operated arm to hang freely. Make sure to do these exercises passively - not using you shoulder muscles. These exercises can be performed once your nerve block effects have worn off.  Repeat 20 times. Do 3 sessions per day.     

## 2020-01-06 NOTE — Anesthesia Postprocedure Evaluation (Signed)
Anesthesia Post Note  Patient: Jason Gill  Procedure(s) Performed: TOTAL SHOULDER ARTHROPLASTY (Right Shoulder)     Patient location during evaluation: PACU Anesthesia Type: General Level of consciousness: awake Pain management: pain level controlled Vital Signs Assessment: post-procedure vital signs reviewed and stable Respiratory status: spontaneous breathing Cardiovascular status: stable Postop Assessment: no apparent nausea or vomiting Anesthetic complications: no   No complications documented.  Last Vitals:  Vitals:   01/06/20 1045 01/06/20 1100  BP: (!) 153/82 137/81  Pulse: 77 80  Resp: 17 19  Temp:    SpO2: 99% 92%    Last Pain:  Vitals:   01/06/20 1100  TempSrc:   PainSc: 0-No pain                 Huston Foley

## 2020-01-06 NOTE — Transfer of Care (Signed)
Immediate Anesthesia Transfer of Care Note  Patient: Jason Gill  Procedure(s) Performed: TOTAL SHOULDER ARTHROPLASTY (Right Shoulder)  Patient Location: PACU  Anesthesia Type:General  Level of Consciousness: sedated  Airway & Oxygen Therapy: Patient Spontanous Breathing and Patient connected to face mask oxygen  Post-op Assessment: Report given to RN and Post -op Vital signs reviewed and stable  Post vital signs: Reviewed and stable  Last Vitals:  Vitals Value Taken Time  BP 131/77 01/06/20 1036  Temp    Pulse 85 01/06/20 1037  Resp 9 01/06/20 1037  SpO2 99 % 01/06/20 1037  Vitals shown include unvalidated device data.  Last Pain:  Vitals:   01/06/20 0619  TempSrc:   PainSc: 6       Patients Stated Pain Goal: 5 (26/94/85 4627)  Complications: No complications documented.

## 2020-01-06 NOTE — Evaluation (Signed)
Occupational Therapy Evaluation Patient Details Name: Jason Gill MRN: [id number] DOB: Oct 25, 1965 Today's Date: 01/06/2020    History of Present Illness Patient is a 54 year old male PMH includes non hodgkin's lymphoma, knee arthroscopy x2 admitted for R TOTAL SHOULDER ARTHROPLASTY.   Clinical Impression   Patient and spouse educated in shoulder protocol, prescribed exercises and compensatory strategies for self care. Both verbalize/demo understanding.     Follow Up Recommendations  Follow surgeon's recommendation for DC plan and follow-up therapies    Equipment Recommendations  None recommended by OT       Precautions / Restrictions Precautions Precautions: Shoulder Type of Shoulder Precautions: AROM elbow wrist hand ok; pendulums preferred exercises, PROM sh FF 60 ABD 45 ER 20 for ADL/Hygiene only, can take sling off in controlled environment Shoulder Interventions: Shoulder sling/immobilizer;Off for dressing/bathing/exercises Precaution Booklet Issued: Yes (comment) Required Braces or Orthoses: Sling Restrictions Weight Bearing Restrictions: Yes RUE Weight Bearing: Non weight bearing      Mobility Bed Mobility               General bed mobility comments: in recliner  Transfers Overall transfer level: Independent                    Balance Overall balance assessment: Independent                                         ADL either performed or assessed with clinical judgement   ADL Overall ADL's : Needs assistance/impaired     Grooming: Set up;Sitting   Upper Body Bathing: Minimal assistance;Sitting   Lower Body Bathing: Minimal assistance;Sit to/from stand   Upper Body Dressing : Minimal assistance;Cueing for sequencing;Cueing for compensatory techniques;Sitting Upper Body Dressing Details (indicate cue type and reason): assist to guide R UE into sleeve Lower Body Dressing: Minimal assistance;Sit to/from stand Lower Body  Dressing Details (indicate cue type and reason): assist to pull up underwear on R hip Toilet Transfer: Independent   Toileting- Clothing Manipulation and Hygiene: Minimal assistance       Functional mobility during ADLs: Independent                    Pertinent Vitals/Pain Pain Assessment: No/denies pain     Hand Dominance Right   Extremity/Trunk Assessment Upper Extremity Assessment Upper Extremity Assessment: RUE deficits/detail RUE Deficits / Details: + nerve block, tolerates PROM within parameters during dressing       Cervical / Trunk Assessment Cervical / Trunk Assessment: Normal   Communication Communication Communication: No difficulties   Cognition Arousal/Alertness: Awake/alert Behavior During Therapy: WFL for tasks assessed/performed Overall Cognitive Status: Within Functional Limits for tasks assessed                                           Shoulder Instructions Shoulder Instructions Donning/doffing shirt without moving shoulder: Minimal assistance;Patient able to independently direct caregiver;Caregiver independent with task Method for sponge bathing under operated UE: Patient able to independently direct caregiver;Caregiver independent with task Donning/doffing sling/immobilizer: Patient able to independently direct caregiver;Caregiver independent with task Correct positioning of sling/immobilizer: Caregiver independent with task;Patient able to independently direct caregiver Pendulum exercises (written home exercise program): Caregiver independent with task;Patient able to independently direct caregiver ROM for elbow, wrist and digits  of operated UE: Caregiver independent with task;Patient able to independently direct caregiver Sling wearing schedule (on at all times/off for ADL's): Caregiver independent with task;Patient able to independently direct caregiver Proper positioning of operated UE when showering: Patient able to  independently direct caregiver;Caregiver independent with task Dressing change: Caregiver independent with task;Patient able to independently direct caregiver Positioning of UE while sleeping: Caregiver independent with task;Patient able to independently direct caregiver    Home Living Family/patient expects to be discharged to:: Private residence Living Arrangements: Spouse/significant other;Children Available Help at Discharge: Family Type of Home: House Home Access: Stairs to enter Technical brewer of Steps: 1   Home Layout: Multi-level Alternate Level Stairs-Number of Steps: 12   Bathroom Shower/Tub: Chief Strategy Officer: None          Prior Functioning/Environment Level of Independence: Independent                 OT Problem List: Pain;Impaired UE functional use         OT Goals(Current goals can be found in the care plan section) Acute Rehab OT Goals Patient Stated Goal: go home OT Goal Formulation: With patient                AM-PAC OT "6 Clicks" Daily Activity     Outcome Measure Help from another person eating meals?: None Help from another person taking care of personal grooming?: A Little Help from another person toileting, which includes using toliet, bedpan, or urinal?: A Little Help from another person bathing (including washing, rinsing, drying)?: A Little Help from another person to put on and taking off regular upper body clothing?: A Little Help from another person to put on and taking off regular lower body clothing?: A Little 6 Click Score: 19   End of Session Equipment Utilized During Treatment: Other (comment) (sling) Nurse Communication: Mobility status  Activity Tolerance: Patient tolerated treatment well Patient left: in chair  OT Visit Diagnosis: Pain Pain - Right/Left: Left Pain - part of body: Shoulder                Time: 4696-2952 OT Time Calculation (min): 32 min Charges:  OT General  Charges $OT Visit: 1 Visit OT Evaluation $OT Eval Low Complexity: 1 Low OT Treatments $Self Care/Home Management : 8-22 mins  Delbert Phenix OT Pager: [place] 01/06/2020, 12:56 PM

## 2020-01-06 NOTE — Anesthesia Procedure Notes (Signed)
Anesthesia Regional Block: Interscalene brachial plexus block   Pre-Anesthetic Checklist: ,, timeout performed, Correct Patient, Correct Site, Correct Laterality, Correct Procedure, Correct Position, site marked, Risks and benefits discussed,  Surgical consent,  Pre-op evaluation,  At surgeon's request and post-op pain management  Laterality: Right and Upper  Prep: chloraprep       Needles:  Injection technique: Single-shot  Needle Type: Echogenic Stimulator Needle     Needle Length: 9cm  Needle Gauge: 20   Needle insertion depth: 1.5 cm   Additional Needles:   Procedures:,,,, ultrasound used (permanent image in chart),,,,  Narrative:  Start time: 01/06/2020 6:57 AM End time: 01/06/2020 7:05 AM Injection made incrementally with aspirations every 5 mL.  Performed by: Personally  Anesthesiologist: Lyn Hollingshead, MD

## 2020-01-06 NOTE — Anesthesia Procedure Notes (Signed)
Procedure Name: MAC Date/Time: 01/06/2020 6:55 AM Performed by: Cynda Familia, CRNA Pre-anesthesia Checklist: Patient identified, Emergency Drugs available, Suction available, Patient being monitored and Timeout performed Patient Re-evaluated:Patient Re-evaluated prior to induction Oxygen Delivery Method: Nasal cannula Placement Confirmation: positive ETCO2 and breath sounds checked- equal and bilateral Dental Injury: Teeth and Oropharynx as per pre-operative assessment

## 2020-01-06 NOTE — H&P (Signed)
Jason Gill    Chief Complaint: Right shoulder osteoarthritis HPI: The patient is a 54 y.o. male with chronic and progressively increasing right shoulder pain related to advanced osteoarthritis.  Symptoms have progressed to the point that patient is experiencing impact on quality of life and ability to perform activities of daily living.  He is brought to the operating room at this time for planned right shoulder anatomic arthroplasty.  Past Medical History:  Diagnosis Date  . Arthritis   . Complication of anesthesia    Small airway difficult intubation  . Follicular lymphoma (HCC)    follicular lymphoma stage III, grade 2,  . Hyperlipidemia   . Hypertension   . Hypothyroidism   . Non Hodgkin's lymphoma ([place])   . OSA on CPAP   . Seasonal allergies     Past Surgical History:  Procedure Laterality Date  . COLONOSCOPY    . KNEE ARTHROSCOPY Right    x2  . LYMPH NODE DISSECTION  2013  . UMBILICAL HERNIA REPAIR      No family history on file.  Social History:  reports that he has never smoked. He has never used smokeless tobacco. He reports current alcohol use. He reports that he does not use drugs.   Medications Prior to Admission  Medication Sig Dispense Refill  . acetaminophen (TYLENOL) 500 MG tablet Take 1,000 mg by mouth every 6 (six) hours as needed for mild pain or headache.    Marland Kitchen amLODipine (NORVASC) 10 MG tablet Take 10 mg by mouth daily.    Marland Kitchen atorvastatin (LIPITOR) 40 MG tablet Take 40 mg by mouth at bedtime.    . candesartan-hydrochlorothiazide (ATACAND HCT) 32-12.5 MG tablet Take 1 tablet by mouth daily.    . cetirizine (ZYRTEC) 10 MG tablet Take 10 mg by mouth daily.    . Cholecalciferol (VITAMIN D3) 1.25 MG (50000 UT) CAPS Take 50,000 Units by mouth once a week. Saturday    . levothyroxine (SYNTHROID) 175 MCG tablet Take 175 mcg by mouth daily.    . meloxicam (MOBIC) 15 MG tablet Take 15 mg by mouth daily.    . Multiple Vitamins-Minerals (MULTIVITAMIN WITH  MINERALS) tablet Take 1 tablet by mouth daily.    Marland Kitchen SAXENDA 18 MG/3ML SOPN Inject 3 mg into the skin daily.       Physical Exam: Right shoulder demonstrates painful and guarded motion as noted at his recent office visits.  Plain radiographs confirm advanced osteoarthritis with complete loss of joint space, peripheral osteophyte formation, and subchondral sclerosis.  Preoperative CT scan was obtained for operative planning.  Vitals     Assessment/Plan  Impression: Right shoulder osteoarthritis  Plan of Action: Procedure(s): TOTAL SHOULDER ARTHROPLASTY  Jason Gill M Jason Gill 01/06/2020, 5:49 AM Contact # [phone]

## 2020-01-07 ENCOUNTER — Encounter (HOSPITAL_COMMUNITY): Payer: Self-pay | Admitting: Orthopedic Surgery

## 2022-05-11 IMAGING — CT CT SHOULDER*R* W/O CM
2 series · 10 of 14 positions shown, 12 images · non-contrast
Comparison: None.

CLINICAL DATA: Chronic right shoulder pain. Patient for replacement
surgery. Preoperative planning study.

EXAM:
CT OF THE UPPER RIGHT EXTREMITY WITHOUT CONTRAST
TECHNIQUE: Multidetector CT imaging of the upper right extremity was performed
according to the standard protocol.

[Series 3: bone · axial · 0.49mm/px · z∈[-256,-96]mm · 5 of 122 slices shown, 7 images]
[im 21/122  soft-tissue]
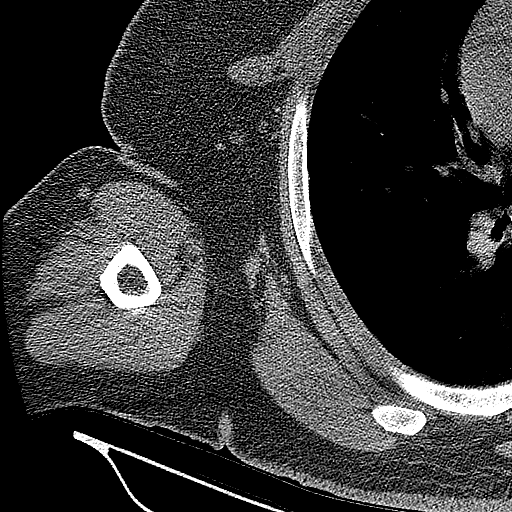
[im 21/122  bone]
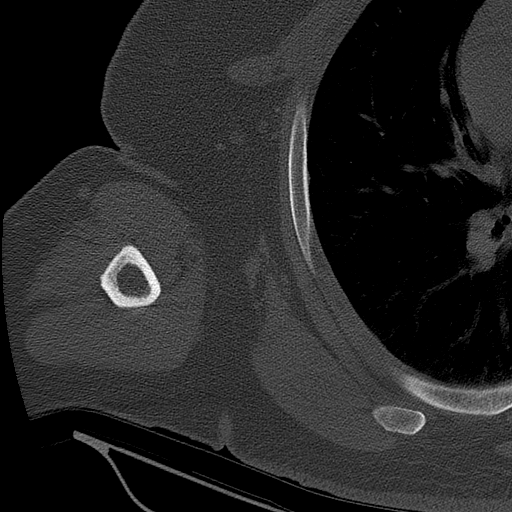
[im 41/122  bone]
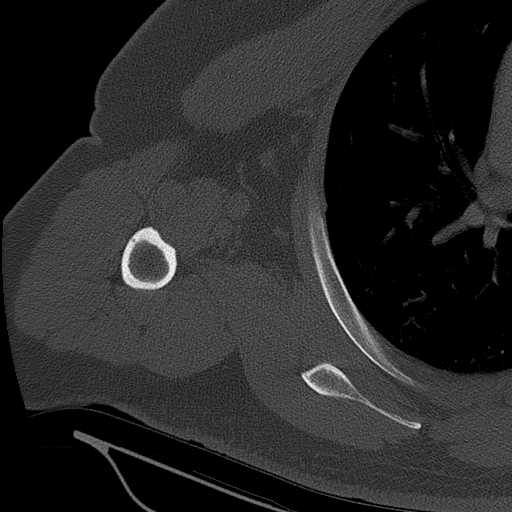
[im 61/122  bone]
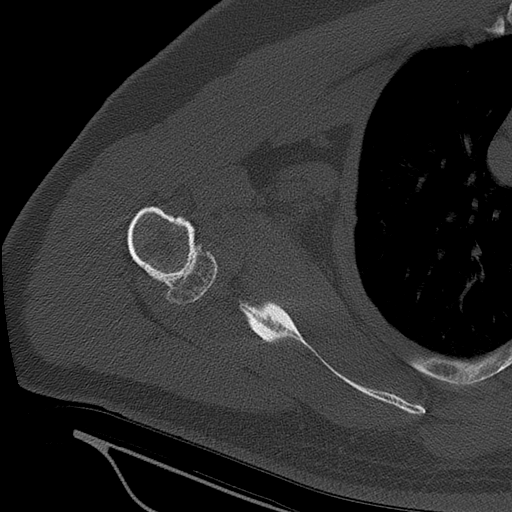
[im 81/122  bone]
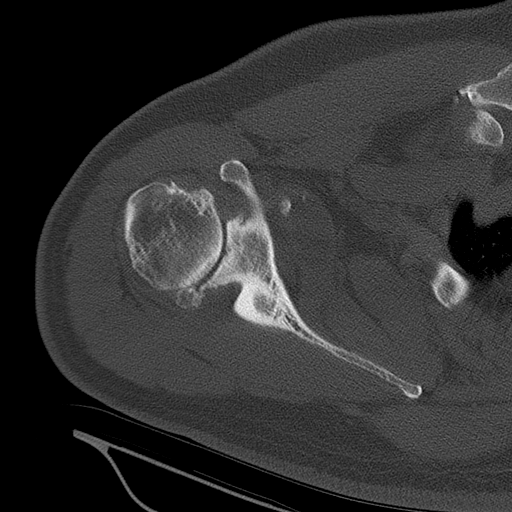
[im 101/122  soft-tissue]
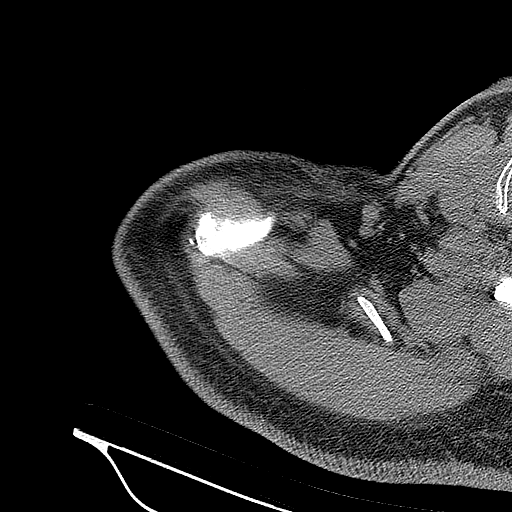
[im 101/122  bone]
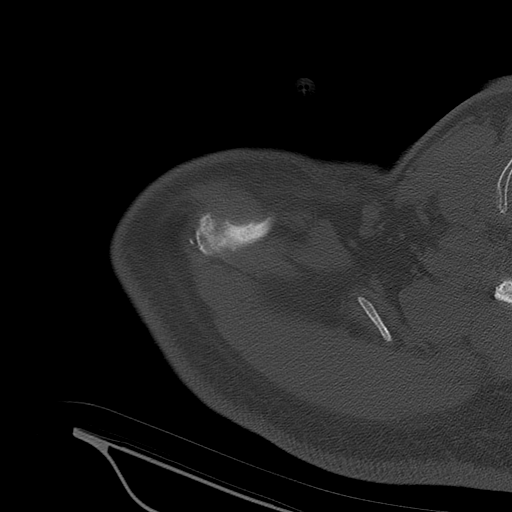

[Series 4: soft tissue · axial · 0.49mm/px · z∈[-254,-94]mm · 5 of 120 slices shown]
[im 20/120  soft-tissue]
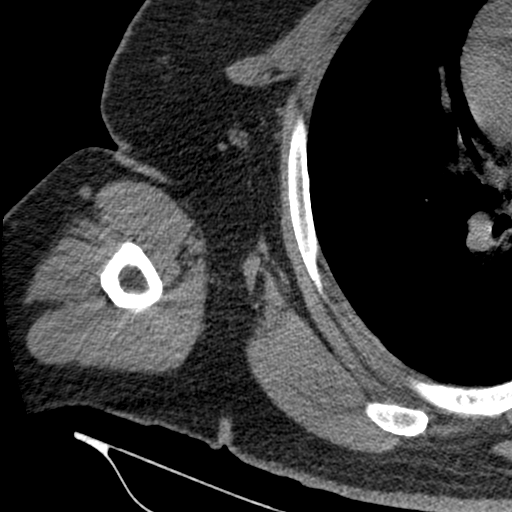
[im 40/120  soft-tissue]
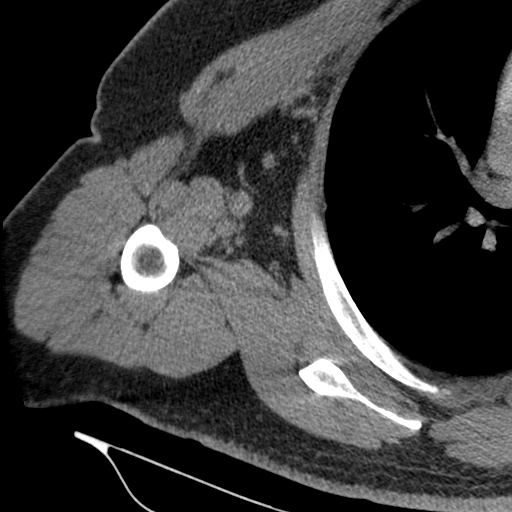
[im 60/120  soft-tissue]
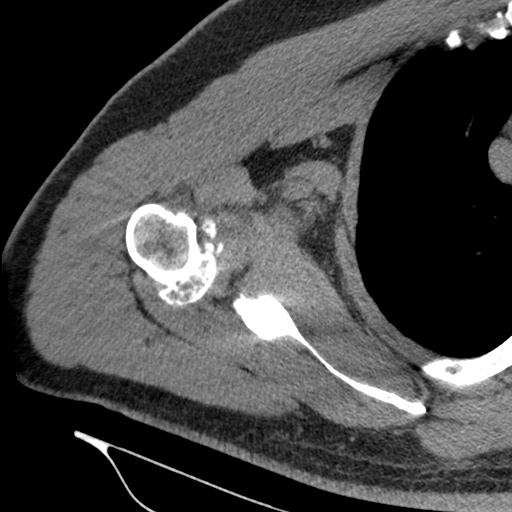
[im 80/120  soft-tissue]
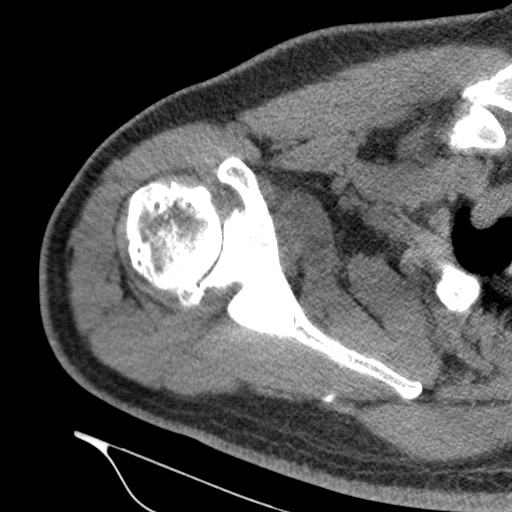
[im 100/120  soft-tissue]
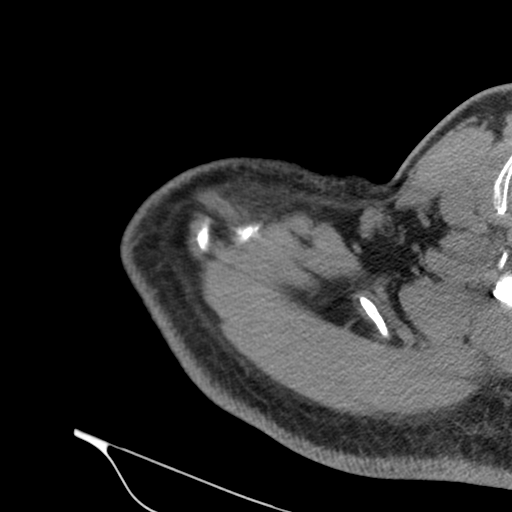

[10 of 14 positions shown; findings below may reference images not displayed]

FINDINGS: Bones/Joint/Cartilage

There is no acute bony or joint abnormality. The patient has
advanced glenohumeral degenerative disease with bone-on-bone joint
space narrowing and a large osteophyte off the humeral head. There
is some subchondral sclerosis in the glenoid but little to no
subchondral cyst formation. Ossification off the posterior rim of
the glenoid measures 3 cm craniocaudal x 1.1 cm AP x 0.9 cm
transverse. A loose body in the subscapularis recess measures 2.8 cm
craniocaudal x 2.8 cm AP x 2.6 cm transverse.

The patient has mild acromioclavicular degenerative change. The
acromion is type 2. No lytic or sclerotic lesion is identified.

Ligaments

Suboptimally assessed by CT.

Muscles and Tendons

The rotator cuff appears intact. There is an intramuscular lipoma
versus fatty atrophy of the supraspinatus. Simple lipoma is favored.

Soft tissues

Imaged lung parenchyma is clear.
IMPRESSION: Advanced glenohumeral osteoarthritis with loose bodies in the joint
as described above.

Mild acromioclavicular osteoarthritis.

The rotator cuff appears intact. Fatty focus in the supraspinatus
has an appearance most suggestive of a simple intramuscular lipoma.
